# Patient Record
Sex: Female | Born: 1955 | Race: White | Hispanic: No | Marital: Married | Smoking: Former smoker
Health system: Southern US, Community
[De-identification: ages and names within clinical notes are randomized; demographics above are authoritative.]

## PROBLEM LIST (undated history)

## (undated) DIAGNOSIS — I614 Nontraumatic intracerebral hemorrhage in cerebellum: Secondary | ICD-10-CM

## (undated) DIAGNOSIS — I639 Cerebral infarction, unspecified: Secondary | ICD-10-CM

## (undated) DIAGNOSIS — E785 Hyperlipidemia, unspecified: Secondary | ICD-10-CM

## (undated) DIAGNOSIS — Z842 Family history of other diseases of the genitourinary system: Secondary | ICD-10-CM

## (undated) DIAGNOSIS — B029 Zoster without complications: Secondary | ICD-10-CM

## (undated) HISTORY — PX: TETRALOGY OF FALLOT REPAIR: SHX796

## (undated) HISTORY — PX: ABDOMINAL HYSTERECTOMY: SHX81

## (undated) HISTORY — PX: URETERAL STENT PLACEMENT: SHX822

## (undated) HISTORY — DX: Nontraumatic intracerebral hemorrhage in cerebellum: I61.4

## (undated) HISTORY — DX: Family history of other diseases of the genitourinary system: Z84.2

## (undated) HISTORY — PX: TUBAL LIGATION: SHX77

## (undated) HISTORY — DX: Zoster without complications: B02.9

## (undated) HISTORY — DX: Hyperlipidemia, unspecified: E78.5

## (undated) HISTORY — DX: Cerebral infarction, unspecified: I63.9

---

## 2008-12-17 ENCOUNTER — Ambulatory Visit: Payer: Self-pay | Admitting: Cardiology

## 2008-12-17 ENCOUNTER — Encounter: Payer: Self-pay | Admitting: Cardiology

## 2008-12-18 ENCOUNTER — Encounter: Payer: Self-pay | Admitting: Cardiology

## 2009-01-06 ENCOUNTER — Ambulatory Visit: Payer: Self-pay | Admitting: Cardiology

## 2009-01-06 DIAGNOSIS — Q213 Tetralogy of Fallot: Secondary | ICD-10-CM

## 2009-01-06 DIAGNOSIS — I6789 Other cerebrovascular disease: Secondary | ICD-10-CM | POA: Insufficient documentation

## 2009-01-06 DIAGNOSIS — G459 Transient cerebral ischemic attack, unspecified: Secondary | ICD-10-CM | POA: Insufficient documentation

## 2009-01-06 DIAGNOSIS — I498 Other specified cardiac arrhythmias: Secondary | ICD-10-CM

## 2009-01-20 ENCOUNTER — Telehealth (INDEPENDENT_AMBULATORY_CARE_PROVIDER_SITE_OTHER): Payer: Self-pay | Admitting: *Deleted

## 2009-03-03 ENCOUNTER — Ambulatory Visit: Payer: Self-pay | Admitting: Cardiology

## 2009-03-05 ENCOUNTER — Encounter: Payer: Self-pay | Admitting: Cardiology

## 2009-03-15 ENCOUNTER — Encounter (INDEPENDENT_AMBULATORY_CARE_PROVIDER_SITE_OTHER): Payer: Self-pay | Admitting: *Deleted

## 2009-05-25 ENCOUNTER — Ambulatory Visit: Payer: Self-pay | Admitting: Cardiology

## 2010-05-31 NOTE — Assessment & Plan Note (Signed)
Summary: 3 mo fu per dec reminder-srs   Visit Type:  Follow-up Primary Provider:  Lajuana Matte (In [place])  CC:  follow-up visit.  History of Present Illness: the patient is a 55 year old female with history of repair of tetralogy of flow at age 68. 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 There was an area of Michael hemorrhage in the right cerebellum. An echocardiogram demonstrated normal LV function and left atrial chamber size being normal. The right ventricle is mildly dilated and right ventricle systolic function is mildly reduced. However the pulmonary outflow tract could not will be visualized. There was no residual VSD and again the pulmonic count which could not be visualized. Subsequently the patient wore a CardioNet monitor to make sure that showed no significant arrhythmias. In particular there was no evidence of nonsustained ventricular tachycardia or atrial fibrillation. Of note was a did not feel an TEE was indicated given the fact that the patient did not have pathologic pulmonic murmur. The patient presents for followup. She states having quite well. She denies any chest pain shortness of breath either at rest or on exertion. She has no orthopnea or PND.  Clinical Review Panels:  Echocardiogram Echocardiogram 1. The study quality is poor.          2. The left ventricular chamber size is normal.         3. Global left ventricular wall motion and contractility are within         normal limits.         4. The EF is 60%.         5. The right ventricle is mild to moderately dilated.          6. The right ventricular global systolic function is moderately         reduced.         7. There is mild aortic regurgitation.          8. There is mild mitral regurgitation.          9. The pulmonic valve is not well visualized.         10. 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 There is no old  study to compare. There is no obvious residual VSD. I can not see the         pulmonary outflow tract well. There is RV dysfunction. (12/17/2008)  Carotid Studies Carotid Doppler Results RIGHT CAROTID ARTERY: Minimal calcified plaque in the right bulb.         Moderate plaque at the origin of the right external carotid artery.         Low resistance Doppler wave form with sharp upstroke in the         internal.                   RIGHT VERTEBRAL ARTERY:  Antegrade.                   LEFT CAROTID ARTERY: Minimal plaque in the bulb.  Low resistance         Doppler wave form in the internal.  Sharp upstroke is noted.                   LEFT VERTEBRAL ARTERY:  Antegrade.                   IMPRESSION:  Less than 50% stenosis in the right and left internal carotid         arteries.  Mild calcified plaque in the right bulb. (12/17/2008)    Preventive Screening-Counseling & Management  Alcohol-Tobacco     Smoking Status: quit     Year Quit: 12/16/2008  Current Medications (verified): 1)  Simvastatin 20 Mg Tabs (Simvastatin) .... Take One Tablet By Mouth Daily At Bedtime 2)  Zoloft 100 Mg Tabs (Sertraline Hcl) .... Take 1 1/2 Tablet By Mouth Once Daily 3)  Valtrex 500 Mg Tabs (Valacyclovir Hcl) .... Take 1 Tablet By Mouth Once A Day 4)  Aspirin Ec 325 Mg Tbec (Aspirin) .... Take One Tablet By Mouth Daily 5)  Fish Oil 1000 Mg Caps (Omega-3 Fatty Acids) .... Take 2 Capsules At Bedtime 6)  Glucosamine Chondr 1500 Complx  Caps (Glucosamine-Chondroit-Vit C-Mn) .... Take 2 Capsules At Bedtime 7)  Metoprolol Tartrate 50 Mg Tabs (Metoprolol Tartrate) .... Take 1 Tablet By Mouth Two Times A Day  Allergies (verified): No Known Drug Allergies  Comments:  Nurse/Medical Assistant: The patient's medications and allergies were reviewed with the patient and were updated in the Medication and Allergy Lists. Verbally gave names.  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  Alcohol Use - no Drug Use - no  Risk Factors: Smoking Status: quit (05/25/2009)  Review of Systems  The patient denies fatigue, malaise, fever, weight gain/loss, vision loss, decreased hearing, hoarseness, chest pain, palpitations, shortness of breath, prolonged cough, wheezing, sleep apnea, coughing up blood, abdominal pain, blood in stool, nausea, vomiting, diarrhea, heartburn, incontinence, blood in urine, muscle weakness, joint pain, leg swelling, rash, skin lesions, headache, fainting, dizziness, depression, anxiety, enlarged lymph nodes, easy bruising or bleeding, and environmental allergies.    Vital Signs:  Patient profile:   55 year old female Height:      68 inches Weight:      230 pounds Pulse rate:   60 / minute BP sitting:   118 / 75  (left arm) Cuff size:   large  Vitals Entered By: Carlye Grippe (May 25, 2009 1:56 PM) CC: follow-up visit   Physical Exam  Additional Exam:  General: Well-developed, well-nourished in no distress head: Normocephalic and atraumatic eyes PERRLA/EOMI intact, conjunctiva and lids normal nose: No deformity or lesions mouth normal dentition, normal posterior pharynx neck: Supple, no JVD.  No masses, thyromegaly or abnormal cervical nodes lungs: Normal breath sounds bilaterally without wheezing.  Normal percussion heart: regular rate and rhythm with normal S1 and S2, no S3 or S4.  PMI is normal.  No pathological murmurs, in particular no pathologic murmur at the level of the  pulmonic valve and right ventricle outflow tract abdomen: Normal bowel sounds, abdomen is soft and nontender without masses, organomegaly or hernias noted.  No hepatosplenomegaly musculoskeletal: Back normal, normal gait muscle strength and tone normal pulsus: Pulse is normal in all 4 extremities Extremities: No peripheral pitting edema neurologic: Alert and oriented x 3 skin: Intact without lesions or rashes cervical nodes: No significant adenopathy psychologic: Normal affect    Impression &  Recommendations:  Problem # 1:  ACUTE BUT ILL-DEFINED CEREBROVASCULAR DISEASE (ICD-436) no evidence of atrial fibrillation or other arrhythmias. At the present time no clear indication for Coumadin therapy. 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 Clinically there is no evidence of residual VSD or pathology of the right ventricle outflow tract. The right ventricle is dilated and there is some RV dysfunction but the patient is asymptomatic. We can follow this clinically for the time being  Patient Instructions: 1)  Your physician recommends that you continue on your current medications as directed. Please refer to the Current Medication list given to you today. 2)  Follow up in  1 year.

## 2017-01-12 ENCOUNTER — Encounter: Payer: Self-pay | Admitting: *Deleted

## 2017-01-15 ENCOUNTER — Ambulatory Visit (INDEPENDENT_AMBULATORY_CARE_PROVIDER_SITE_OTHER): Payer: BLUE CROSS/BLUE SHIELD | Admitting: Cardiovascular Disease

## 2017-01-15 ENCOUNTER — Encounter: Payer: Self-pay | Admitting: *Deleted

## 2017-01-15 ENCOUNTER — Encounter: Payer: Self-pay | Admitting: Cardiovascular Disease

## 2017-01-15 ENCOUNTER — Telehealth: Payer: Self-pay | Admitting: Cardiovascular Disease

## 2017-01-15 VITALS — BP 132/79 | HR 66 | Ht 65.0 in | Wt 241.0 lb

## 2017-01-15 DIAGNOSIS — I10 Essential (primary) hypertension: Secondary | ICD-10-CM

## 2017-01-15 DIAGNOSIS — I429 Cardiomyopathy, unspecified: Secondary | ICD-10-CM

## 2017-01-15 DIAGNOSIS — Z9289 Personal history of other medical treatment: Secondary | ICD-10-CM

## 2017-01-15 DIAGNOSIS — I4892 Unspecified atrial flutter: Secondary | ICD-10-CM | POA: Diagnosis not present

## 2017-01-15 DIAGNOSIS — R0609 Other forms of dyspnea: Secondary | ICD-10-CM | POA: Diagnosis not present

## 2017-01-15 DIAGNOSIS — Q249 Congenital malformation of heart, unspecified: Secondary | ICD-10-CM

## 2017-01-15 DIAGNOSIS — R0789 Other chest pain: Secondary | ICD-10-CM | POA: Diagnosis not present

## 2017-01-15 MED ORDER — METOPROLOL SUCCINATE ER 50 MG PO TB24: 50.0000 mg | ORAL_TABLET | Freq: Two times a day (BID) | ORAL | 6 refills | Status: DC

## 2017-01-15 MED ORDER — SACUBITRIL-VALSARTAN 24-26 MG PO TABS: 1.0000 | ORAL_TABLET | Freq: Two times a day (BID) | ORAL | 6 refills | Status: DC

## 2017-01-15 NOTE — Telephone Encounter (Signed)
Lexiscan - chest pressure, cardiomyopathy Sept 25, 2018 at [facility]

## 2017-01-15 NOTE — Progress Notes (Signed)
CARDIOLOGY CONSULT NOTE  Patient ID: Vanessa Kennedy MRN: [id number] DOB/AGE: Jun 05, 1955 61 y.o.  Admit date: (Not on file) Primary Physician: System, Pcp Not In Referring Physician: Dr. Elby Beck  Reason for Consultation: rapid atrial flutter  HPI: Vanessa Kennedy is a 61 y.o. female who is being seen today for the evaluation of Rapid atrial flutter at the request of Timmie Foerster, MD.   She was hospitalized earlier this month at [facility]. 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  She last saw Dr. Ambrose Mantle at [facility] on 11/08/12.  Cardiac magnetic resonance imaging on 11/23/11 demonstrated low normal left ventricular systolic function, LVEF 50% with severe dilatation of the right ventricle and mildly reduced right ventricular systolic function, RVEF 39%. There was mild eccentric pulmonic regurgitation and no evidence of atrial or ventricular septal defect at that time. There was biatrial enlargement.  She presented to the hospital there with worsening shortness of breath with exertion. ECG demonstrated atrial flutter with variable conduction. Chest x-ray was suggestive of CHF.  I reviewed all relevant documentation, labs, and studies.  Labs: Sodium 140, potassium 4, BUN 20, creatinine 0.75, troponin normal, N-terminal proBNP 1992, hemoglobin 12.2, white blood cells 5.9, platelets 156.  Echocardiogram performed on 01/04/17 at [facility] reportedly demonstrated moderately reduced left ventricular systolic function, LVEF 30-35%, grade 2 diastolic dysfunction, elevated filling pressures, mild LVH, moderate left atrial dilatation, moderate to severe right ventricular dilatation, moderately reduced right ventricular systolic function, moderate right atrial dilatation, mild aortic regurgitation, and small VSD with left-to-right shunting.  I personally reviewed the ECG performed on 01/03/17 which  demonstrated atrial flutter, 106 bpm, nonspecific ST segment and T-wave abnormalities, with RBBB.  She also has a history of retroperitoneal fibrosis with ureteral stents.  Since being hospitalized, she has felt significantly fatigued. She is tired after doing simple activities such as cooking and washing dishes. 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 She has not gone back to work. She has occasional chest pressure with exertion. She denies leg swelling, orthopnea, and paroxysmal nocturnal dyspnea.  Labs 01/04/17: TSH 0.25, free T4 1.36, free T3 2.6.  ECG performed in the office today which I ordered and personally interpreted demonstrated atrial flutter with variable conduction, heart rate 92 bpm, with right bundle-branch block.   No Known Allergies  Current Outpatient Prescriptions  Medication Sig Dispense Refill  . apixaban (ELIQUIS) 5 MG TABS tablet Take 5 mg by mouth 2 (two) times daily.    . cyclobenzaprine (FLEXERIL) 10 MG tablet Take 10 mg by mouth 3 (three) times daily.    . ferrous sulfate 325 (65 FE) MG tablet Take 325 mg by mouth daily with breakfast.    . furosemide (LASIX) 40 MG tablet Take 40 mg by mouth.    . METOPROLOL TARTRATE PO Take 50 mg by mouth 2 (two) times daily.    . Potassium 99 MG TABS Take by mouth.    . sertraline (ZOLOFT) 100 MG tablet Take 100 mg by mouth daily. 1 and 1/2 tab daily    . tamsulosin (FLOMAX) 0.4 MG CAPS capsule Take 0.4 mg by mouth.    . valACYclovir (VALTREX) 500 MG tablet Take 500 mg by mouth daily.     No current facility-administered medications for this visit.     Past Medical History:  Diagnosis Date  . 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 FH: TAH-BSO (total abdominal hysterectomy and bilateral salpingo-oophorectomy)   . Hemorrhagic cerebellum (HCC)    right cerebellum microhemorrage  . Hyperlipidemia   . Shingles      Past Surgical History:  Procedure Laterality Date  . ABDOMINAL HYSTERECTOMY    . TETRALOGY OF FALLOT REPAIR    . TUBAL LIGATION    . URETERAL STENT PLACEMENT      Social History   Social History  . Marital status: Married    Spouse name: N/A  . Number of children: N/A  . Years of education: N/A   Occupational History  . Not on file.   Social History Main Topics  . Smoking status: Former Smoker    Quit date: 05/01/2008  . Smokeless tobacco: Never Used  . Alcohol use Yes  . Drug use: No  . Sexual activity: Not on file   Other Topics Concern  . Not on file   Social History Narrative  . No narrative on file     No family history of premature CAD in 1st degree relatives.  Current Meds  Medication Sig  . apixaban (ELIQUIS) 5 MG TABS tablet Take 5 mg by mouth 2 (two) times daily.  . cyclobenzaprine (FLEXERIL) 10 MG tablet Take 10 mg by mouth 3 (three) times daily.  . ferrous sulfate 325 (65 FE) MG tablet Take 325 mg by mouth daily with breakfast.  . furosemide (LASIX) 40 MG tablet Take 40 mg by mouth.  . METOPROLOL TARTRATE PO Take 50 mg by mouth 2 (two) times daily.  . Potassium 99 MG TABS Take by mouth.  . sertraline (ZOLOFT) 100 MG tablet Take 100 mg by mouth daily. 1 and 1/2 tab daily  . tamsulosin (FLOMAX) 0.4 MG CAPS capsule Take 0.4 mg by mouth.  . valACYclovir (VALTREX) 500 MG tablet Take 500 mg by mouth daily.      Review of systems complete and found to be negative unless listed above in HPI    Physical exam Blood pressure 132/79, pulse 66, height  (1.651 m), weight 241 lb (109.3 kg), SpO2 92 %. General: NAD Neck: No JVD, no thyromegaly or thyroid nodule.  Lungs: Clear to auscultation bilaterally with normal respiratory effort. CV: Nondisplaced PMI. Regular rate and irregular rhythm, normal S1/S2, no S3, no murmur.  No peripheral edema.  No carotid bruit.    Abdomen: Soft, nontender, no distention.  Skin: Intact without lesions or rashes.   Neurologic: Alert and oriented x 3.  Psych: Normal affect. Extremities: No clubbing or cyanosis.  HEENT: Normal.   ECG: Most recent ECG reviewed.   Labs: No results found for: K, BUN, CREATININE, ALT, TSH, HGB   Lipids: No results found for: LDLCALC, LDLDIRECT, CHOL, TRIG, HDL      ASSESSMENT AND PLAN:  1. Atrial flutter with exertional dyspnea: Currently on metoprolol tartrate 50 mg twice daily. Given her cardiomyopathy, I will switch to Toprol-XL initially 50 mg twice daily. She is anticoagulated with apixaban. I would consider EP referral for ablation. She does have moderate left atrial enlargement.  2. Cardiomyopathy with moderately reduced left ventricle systolic function and chest pressure: She is currently on Lasix 40 mg daily and appears euvolemic. Currently on metoprolol tartrate 50 mg twice daily. Given her cardiomyopathy, I will switch to Toprol-XL initially 50 mg twice daily. I will also add Entresto 24/26 mg twice daily. Unclear as  to the etiology. I will obtain a Lexiscan Myoview stress test to evaluate for ischemic heart disease.  3. Hypertension: Controlled. Monitor given medication adjustments as noted above.  4. Repaired Tetralogy of Fallot: I will make a referral to Dr. Virgina Jock, and adult congenital heart disease specialist with [facility].    Disposition: Follow up with me in 1 month.   Signed: Prentice Docker, M.D., F.A.C.C.  01/15/2017, 3:21 PM

## 2017-01-15 NOTE — Patient Instructions (Addendum)
Medication Instructions:   Stop Lopressor.   Begin Toprol XL  twice a day.  Begin Entresto 24/26mg  twice a day.  Continue all other medications.    Labwork: none  Testing/Procedures:  Your physician has requested that you have a lexiscan myoview. For further information please visit [email or web address] Please follow instruction sheet, as given.  Office will contact with results via phone or letter.    Follow-Up: 1 month   Any Other Special Instructions Will Be Listed Below (If Applicable). You have been referred to:  Dr. Darcel Bayley - congenital heart disease specialist with [facility].    If you need a refill on your cardiac medications before your next appointment, please call your pharmacy.

## 2017-01-23 ENCOUNTER — Encounter (HOSPITAL_COMMUNITY)
Admission: RE | Admit: 2017-01-23 | Discharge: 2017-01-23 | Disposition: A | Discharge status: Home / Self Care | Payer: BLUE CROSS/BLUE SHIELD | Source: Ambulatory Visit | Attending: Cardiovascular Disease | Admitting: Cardiovascular Disease

## 2017-01-23 ENCOUNTER — Encounter (HOSPITAL_BASED_OUTPATIENT_CLINIC_OR_DEPARTMENT_OTHER)
Admission: RE | Admit: 2017-01-23 | Discharge: 2017-01-23 | Disposition: A | Discharge status: Home / Self Care | Payer: BLUE CROSS/BLUE SHIELD | Source: Ambulatory Visit | Attending: Cardiovascular Disease | Admitting: Cardiovascular Disease

## 2017-01-23 ENCOUNTER — Encounter (HOSPITAL_COMMUNITY): Payer: Self-pay

## 2017-01-23 DIAGNOSIS — R0789 Other chest pain: Secondary | ICD-10-CM

## 2017-01-23 DIAGNOSIS — I429 Cardiomyopathy, unspecified: Secondary | ICD-10-CM | POA: Diagnosis not present

## 2017-01-23 LAB — NM MYOCAR MULTI W/SPECT W/WALL MOTION / EF
CHL CUP NUCLEAR SRS: 4
CHL CUP NUCLEAR SSS: 16
CSEPPHR: 95 {beats}/min
LV dias vol: 152 mL (ref 46–106)
LV sys vol: 81 mL
RATE: 0.34
Rest HR: 65 {beats}/min
SDS: 12
TID: 0.93

## 2017-01-23 MED ORDER — TECHNETIUM TC 99M TETROFOSMIN IV KIT
10.0000 | PACK | Freq: Once | INTRAVENOUS | Status: AC | PRN
  Administered 2017-01-23: 9 via INTRAVENOUS

## 2017-01-23 MED ORDER — TECHNETIUM TC 99M TETROFOSMIN IV KIT
30.0000 | PACK | Freq: Once | INTRAVENOUS | Status: AC | PRN
  Administered 2017-01-23: 30 via INTRAVENOUS

## 2017-01-23 MED ORDER — REGADENOSON 0.4 MG/5ML IV SOLN
INTRAVENOUS | Status: AC
  Administered 2017-01-23: 0.4 mg via INTRAVENOUS
  Filled 2017-01-23: qty 5

## 2017-01-23 MED ORDER — SODIUM CHLORIDE 0.9% FLUSH
INTRAVENOUS | Status: AC
  Administered 2017-01-23: 10 mL via INTRAVENOUS
  Filled 2017-01-23: qty 10

## 2017-01-24 ENCOUNTER — Telehealth: Payer: Self-pay | Admitting: *Deleted

## 2017-01-24 NOTE — Telephone Encounter (Signed)
Notes recorded by Lesle Chris, LPN on 1/61/0960 at 9:29 AM EDT Patient notified. Appointment moved up from 02-14-17 to 01-31-17 at 1:40 in the [place] office per SK. ------  Notes recorded by Laqueta Linden, MD on 01/23/2017 at 4:00 PM EDT Have her appt moved up to 1:40 on 10/3 so that I can discuss stress test findings, which are suggestive of blockages.

## 2017-01-31 ENCOUNTER — Encounter: Payer: Self-pay | Admitting: Cardiovascular Disease

## 2017-01-31 ENCOUNTER — Other Ambulatory Visit: Payer: Self-pay

## 2017-01-31 ENCOUNTER — Other Ambulatory Visit: Payer: Self-pay | Admitting: Cardiovascular Disease

## 2017-01-31 ENCOUNTER — Ambulatory Visit (INDEPENDENT_AMBULATORY_CARE_PROVIDER_SITE_OTHER): Payer: BLUE CROSS/BLUE SHIELD | Admitting: Cardiovascular Disease

## 2017-01-31 ENCOUNTER — Telehealth: Payer: Self-pay

## 2017-01-31 VITALS — BP 138/68 | HR 66 | Ht 65.0 in | Wt 243.0 lb

## 2017-01-31 DIAGNOSIS — Z01818 Encounter for other preprocedural examination: Secondary | ICD-10-CM

## 2017-01-31 DIAGNOSIS — R0609 Other forms of dyspnea: Secondary | ICD-10-CM

## 2017-01-31 DIAGNOSIS — I429 Cardiomyopathy, unspecified: Secondary | ICD-10-CM

## 2017-01-31 DIAGNOSIS — Q249 Congenital malformation of heart, unspecified: Secondary | ICD-10-CM | POA: Diagnosis not present

## 2017-01-31 DIAGNOSIS — I10 Essential (primary) hypertension: Secondary | ICD-10-CM

## 2017-01-31 DIAGNOSIS — R9439 Abnormal result of other cardiovascular function study: Secondary | ICD-10-CM | POA: Diagnosis not present

## 2017-01-31 DIAGNOSIS — R0789 Other chest pain: Secondary | ICD-10-CM | POA: Diagnosis not present

## 2017-01-31 DIAGNOSIS — I4892 Unspecified atrial flutter: Secondary | ICD-10-CM | POA: Diagnosis not present

## 2017-01-31 DIAGNOSIS — I209 Angina pectoris, unspecified: Secondary | ICD-10-CM

## 2017-01-31 NOTE — Progress Notes (Signed)
SUBJECTIVE: The patient returns for follow-up after undergoing cardiovascular testing performed for the evaluation of chest pain.  Nuclear stress test on 01/23/17 demonstrated anterior myocardial infarction with mild to moderate peri-infarct ischemia. There was also a moderate area of inferior wall ischemia. It was a high risk study.  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  Echocardiogram performed on 01/04/17 at [facility] reportedly demonstrated moderately reduced left ventricular systolic function, LVEF 30-35%, grade 2 diastolic dysfunction, elevated filling pressures, mild LVH, moderate left atrial dilatation, moderate to severe right ventricular dilatation, moderately reduced right ventricular systolic function, moderate right atrial dilatation, mild aortic regurgitation, and small VSD with left-to-right shunting.  I switched metoprolol tartrate to Toprol-XL 50 mg twice daily and added Entresto at her last visit.  I also made a referral to Dr. Virgina Jock, and adult congenital heart disease specialist with [facility].  She feels very weak and tired after performing activities such as washing the dishes and cooking. She has exertional dyspnea. She has had lightheadedness and dizziness but denies syncope. She has headaches every morning. She continues to experience chest pressure.  She is scheduled to see Dr. Deatra James on 03/08/17.   Review of Systems: As per "subjective", otherwise negative.  No Known Allergies  Current Outpatient Prescriptions  Medication Sig Dispense Refill  . apixaban (ELIQUIS) 5 MG TABS tablet Take 5 mg by mouth 2 (two) times daily.    . cyclobenzaprine (FLEXERIL) 10 MG tablet Take 10 mg by mouth 3 (three) times daily.    . ferrous sulfate 325 (65 FE) MG tablet Take 325 mg by mouth daily with breakfast.    . furosemide  (LASIX) 40 MG tablet Take 40 mg by mouth.    . metoprolol succinate (TOPROL-XL) 50 MG 24 hr tablet Take 1 tablet (50 mg total) by mouth 2 (two) times daily. 60 tablet 6  . Potassium 99 MG TABS Take by mouth.    . sacubitril-valsartan (ENTRESTO) 24-26 MG Take 1 tablet by mouth 2 (two) times daily. 60 tablet 6  . sertraline (ZOLOFT) 100 MG tablet Take 150 mg by mouth daily. 1 and 1/2 tab daily      . tamsulosin (FLOMAX) 0.4 MG CAPS capsule Take 0.4 mg by mouth.    . valACYclovir (VALTREX) 500 MG tablet Take 1,000 mg by mouth daily.      No current facility-administered medications for this visit.     Past Medical History:  Diagnosis Date  . 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 FH: TAH-BSO (total abdominal hysterectomy and bilateral salpingo-oophorectomy)   . Hemorrhagic cerebellum (HCC)    right cerebellum microhemorrage  . Hyperlipidemia   . Shingles     Past Surgical History:  Procedure Laterality Date  . ABDOMINAL HYSTERECTOMY    . TETRALOGY OF FALLOT REPAIR    . TUBAL LIGATION    . URETERAL STENT PLACEMENT      Social History   Social History  . Marital status: Married    Spouse name: N/A  . Number of children: N/A  . Years of education: N/A   Occupational History  . Not on file.   Social History Main Topics  . Smoking status: Former Smoker    Quit date: 05/01/2008  . Smokeless tobacco: Never Used  . Alcohol use No  .  Drug use: No  . Sexual activity: Yes   Other Topics Concern  . Not on file   Social History Narrative  . No narrative on file     Vitals:   01/31/17 1343  BP: 138/68  Pulse: 66  SpO2: 95%  Weight: 243 lb (110.2 kg)  Height: 5' 5" (1.651 m)    Wt Readings from Last 3 Encounters:  01/31/17 243 lb (110.2 kg)  01/15/17 241 lb (109.3 kg)  05/25/09 (!) 230 lb (104.3 kg)     PHYSICAL EXAM General: NAD HEENT: Normal. Neck: No JVD, no thyromegaly. Lungs: Clear to auscultation bilaterally with  normal respiratory effort. CV: Nondisplaced PMI.  Regular rate and rhythm, normal S1/S2, no S3/S4, no murmur. No pretibial or periankle edema.    Abdomen: Soft, nontender, no distention.  Neurologic: Alert and oriented.  Psych: Normal affect. Skin: Normal. Musculoskeletal: No gross deformities.    ECG: Most recent ECG reviewed.   Labs: No results found for: K, BUN, CREATININE, ALT, TSH, HGB   Lipids: No results found for: LDLCALC, LDLDIRECT, CHOL, TRIG, HDL     ASSESSMENT AND PLAN:  1. Atrial flutter: She is stable from an arrhythmic standpoint. Continue Toprol-XL 50 mg twice daily. She is anticoagulated with apixaban. I would consider EP referral for ablation. She does have moderate left atrial enlargement.  2. Cardiomyopathy with moderately reduced left ventricle systolic function and chest pressure with exertional dyspnea: Nuclear stress test markedly abnormal as detailed above suggestive of ischemic heart disease. I will arrange for right and left heart catheterization and coronary angiography. Risks and benefits of cardiac catheterization have been discussed with the patient.  These include bleeding, infection, kidney damage, stroke, heart attack, death.  The patient understands these risks and is willing to proceed. She is currently on Lasix 40 mg daily and appears euvolemic. Continue Toprol-XL 50 mg twice daily and Entresto 24/26 mg twice daily.  3. Hypertension: Controlled. No changes.  4. Repaired Tetralogy of Fallot: I have already made a referral to Dr. Rich Krasuski, and adult congenital heart disease specialist with [facility]. She is scheduled to see him on 03/08/17.     Disposition: Follow up with me in 1 month.  Time spent: 40 minutes, of which greater than 50% was spent reviewing symptoms, relevant blood tests and studies, and discussing management plan with the patient.    Suresh Koneswaran, M.D., F.A.C.C. 

## 2017-01-31 NOTE — Patient Instructions (Addendum)
Medication Instructions:  Your physician recommends that you continue on your current medications as directed. Please refer to the Current Medication list given to you today.   Labwork: Wednesday CBC BMET INR  Testing/Procedures: A chest x-ray takes a picture of the organs and structures inside the chest, including the heart, lungs, and blood vessels. This test can show several things, including, whether the heart is enlarges; whether fluid is building up in the lungs; and whether pacemaker / defibrillator leads are still in place.   Your physician has requested that you have a cardiac catheterization. Cardiac catheterization is used to diagnose and/or treat various heart conditions. Doctors may recommend this procedure for a number of different reasons. The most common reason is to evaluate chest pain. Chest pain can be a symptom of coronary artery disease (CAD), and cardiac catheterization can show whether plaque is narrowing or blocking your heart's arteries. This procedure is also used to evaluate the valves, as well as measure the blood flow and oxygen levels in different parts of your heart. For further information please visit [email or web address] Please follow instruction sheet, as given.   Follow-Up: Your physician recommends that you schedule a follow-up appointment in: 1 MONTH    Any Other Special Instructions Will Be Listed Below (If Applicable).     If you need a refill on your cardiac medications before your next appointment, please call your pharmacy.    [facility] CARDIOVASCULAR DIVISION [facility] [place] [address] [place] [address] Dept: [phone] Loc: [phone]  Ninetta Adelstein  01/31/2017  You are scheduled for a Cardiac Catheterization on Thursday, October 11 with Dr. Peter [place].  1. Please arrive at the [facility] (Main Entrance A) at [facility]: [address] [place], [place] [address] at 5:30 AM  (two hours before your procedure to ensure your preparation). Free valet parking service is available.   Special note: Every effort is made to have your procedure done on time. Please understand that emergencies sometimes delay scheduled procedures.  2. Diet: Do not eat or drink anything after midnight prior to your procedure except sips of water to take medications.  3. Labs: You will need to have blood drawn on Monday, October 8 at [facility] [address], [place]  Open: 7am - 6pm, Sat 8am - 12 noon   Phone: [phone]. You do not need to be fasting.  4. Medication instructions in preparation for your procedure:   Stop taking Eliquis (Apixiban) on Tuesday, October 9.    On the morning of your procedure, take your Aspirin and any morning medicines NOT listed above.  You may use sips of water.  5. Plan for one night stay--bring personal belongings. 6. Bring a current list of your medications and current insurance cards. 7. You MUST have a responsible person to drive you home. 8. Someone MUST be with you the first 24 hours after you arrive home or your discharge will be delayed. 9. Please wear clothes that are easy to get on and off and wear slip-on shoes.  Thank you for allowing [place] to care for you!   -- [facility]

## 2017-01-31 NOTE — Telephone Encounter (Signed)
Pt is to have labs done prior to cath.

## 2017-02-05 ENCOUNTER — Ambulatory Visit (HOSPITAL_COMMUNITY)
Admission: RE | Admit: 2017-02-05 | Discharge: 2017-02-05 | Disposition: A | Discharge status: Home / Self Care | Payer: BLUE CROSS/BLUE SHIELD | Source: Ambulatory Visit | Attending: Cardiovascular Disease | Admitting: Cardiovascular Disease

## 2017-02-05 ENCOUNTER — Other Ambulatory Visit (HOSPITAL_COMMUNITY)
Admission: RE | Admit: 2017-02-05 | Discharge: 2017-02-05 | Disposition: A | Discharge status: Home / Self Care | Payer: BLUE CROSS/BLUE SHIELD | Source: Ambulatory Visit | Attending: Cardiovascular Disease | Admitting: Cardiovascular Disease

## 2017-02-05 ENCOUNTER — Encounter (HOSPITAL_COMMUNITY): Payer: Self-pay | Admitting: Radiology

## 2017-02-05 DIAGNOSIS — I700 Atherosclerosis of aorta: Secondary | ICD-10-CM | POA: Diagnosis not present

## 2017-02-05 DIAGNOSIS — Z01818 Encounter for other preprocedural examination: Secondary | ICD-10-CM | POA: Diagnosis present

## 2017-02-05 DIAGNOSIS — Q2546 Tortuous aortic arch: Secondary | ICD-10-CM | POA: Insufficient documentation

## 2017-02-05 DIAGNOSIS — I517 Cardiomegaly: Secondary | ICD-10-CM | POA: Insufficient documentation

## 2017-02-05 LAB — CBC WITH DIFFERENTIAL/PLATELET
BASOS ABS: 0 10*3/uL (ref 0.0–0.1)
BASOS PCT: 0 %
EOS ABS: 0 10*3/uL (ref 0.0–0.7)
Eosinophils Relative: 1 %
HEMATOCRIT: 43.4 % (ref 36.0–46.0)
HEMOGLOBIN: 14 g/dL (ref 12.0–15.0)
Lymphocytes Relative: 28 %
Lymphs Abs: 1.2 10*3/uL (ref 0.7–4.0)
MCH: 29.9 pg (ref 26.0–34.0)
MCHC: 32.3 g/dL (ref 30.0–36.0)
MCV: 92.5 fL (ref 78.0–100.0)
MONOS PCT: 7 %
Monocytes Absolute: 0.3 10*3/uL (ref 0.1–1.0)
NEUTROS ABS: 2.7 10*3/uL (ref 1.7–7.7)
NEUTROS PCT: 64 %
Platelets: 128 10*3/uL — ABNORMAL LOW (ref 150–400)
RBC: 4.69 MIL/uL (ref 3.87–5.11)
RDW: 12.8 % (ref 11.5–15.5)
WBC: 4.3 10*3/uL (ref 4.0–10.5)

## 2017-02-05 LAB — BASIC METABOLIC PANEL
ANION GAP: 8 (ref 5–15)
BUN: 26 mg/dL — ABNORMAL HIGH (ref 6–20)
CHLORIDE: 101 mmol/L (ref 101–111)
CO2: 30 mmol/L (ref 22–32)
CREATININE: 1.04 mg/dL — AB (ref 0.44–1.00)
Calcium: 9.5 mg/dL (ref 8.9–10.3)
GFR calc non Af Amer: 57 mL/min — ABNORMAL LOW (ref >60)
Glucose, Bld: 100 mg/dL — ABNORMAL HIGH (ref 65–99)
POTASSIUM: 4.7 mmol/L (ref 3.5–5.1)
SODIUM: 139 mmol/L (ref 135–145)

## 2017-02-05 LAB — PROTIME-INR
INR: 1.05
PROTHROMBIN TIME: 13.6 s (ref 11.4–15.2)

## 2017-02-07 ENCOUNTER — Telehealth: Payer: Self-pay

## 2017-02-07 NOTE — Telephone Encounter (Signed)
Patient contacted pre-catheterization at [facility] scheduled for:  02/08/2017 @ 0730 Verified arrival time and place:  NT @ 0530 Confirmed AM meds to be taken pre-cath with sip of water: Take ASA Hold Eliquis-last dose 02/05/2017 evening Hold lasix am of Confirmed patient has responsible person to drive home post procedure and observe patient for 24 hours:  yes Addl concerns:  none

## 2017-02-08 ENCOUNTER — Ambulatory Visit (HOSPITAL_COMMUNITY)
Admission: RE | Admit: 2017-02-08 | Discharge: 2017-02-08 | Disposition: A | Discharge status: Home / Self Care | Payer: BLUE CROSS/BLUE SHIELD | Source: Ambulatory Visit | Attending: Cardiology | Admitting: Cardiology

## 2017-02-08 ENCOUNTER — Encounter (HOSPITAL_COMMUNITY)
Admission: RE | Disposition: A | Discharge status: Home / Self Care | Payer: Self-pay | Source: Ambulatory Visit | Attending: Cardiology

## 2017-02-08 ENCOUNTER — Encounter (HOSPITAL_COMMUNITY): Payer: Self-pay | Admitting: Cardiology

## 2017-02-08 DIAGNOSIS — Z87891 Personal history of nicotine dependence: Secondary | ICD-10-CM | POA: Insufficient documentation

## 2017-02-08 DIAGNOSIS — Z7901 Long term (current) use of anticoagulants: Secondary | ICD-10-CM | POA: Diagnosis not present

## 2017-02-08 DIAGNOSIS — I5022 Chronic systolic (congestive) heart failure: Secondary | ICD-10-CM | POA: Diagnosis present

## 2017-02-08 DIAGNOSIS — I4892 Unspecified atrial flutter: Secondary | ICD-10-CM | POA: Diagnosis present

## 2017-02-08 DIAGNOSIS — R0600 Dyspnea, unspecified: Secondary | ICD-10-CM | POA: Diagnosis present

## 2017-02-08 DIAGNOSIS — R42 Dizziness and giddiness: Secondary | ICD-10-CM | POA: Insufficient documentation

## 2017-02-08 DIAGNOSIS — I252 Old myocardial infarction: Secondary | ICD-10-CM | POA: Insufficient documentation

## 2017-02-08 DIAGNOSIS — I429 Cardiomyopathy, unspecified: Secondary | ICD-10-CM | POA: Diagnosis not present

## 2017-02-08 DIAGNOSIS — I209 Angina pectoris, unspecified: Secondary | ICD-10-CM

## 2017-02-08 DIAGNOSIS — R9439 Abnormal result of other cardiovascular function study: Secondary | ICD-10-CM | POA: Diagnosis present

## 2017-02-08 DIAGNOSIS — I10 Essential (primary) hypertension: Secondary | ICD-10-CM | POA: Insufficient documentation

## 2017-02-08 DIAGNOSIS — Z79899 Other long term (current) drug therapy: Secondary | ICD-10-CM | POA: Insufficient documentation

## 2017-02-08 DIAGNOSIS — R51 Headache: Secondary | ICD-10-CM | POA: Diagnosis not present

## 2017-02-08 DIAGNOSIS — Q213 Tetralogy of Fallot: Secondary | ICD-10-CM

## 2017-02-08 HISTORY — PX: RIGHT/LEFT HEART CATH AND CORONARY ANGIOGRAPHY: CATH118266

## 2017-02-08 LAB — POCT I-STAT 3, ART BLOOD GAS (G3+)
Acid-Base Excess: 2 mmol/L (ref 0.0–2.0)
BICARBONATE: 27.7 mmol/L (ref 20.0–28.0)
O2 SAT: 98 %
PCO2 ART: 47.7 mmHg (ref 32.0–48.0)
TCO2: 29 mmol/L (ref 22–32)
pH, Arterial: 7.371 (ref 7.350–7.450)
pO2, Arterial: 113 mmHg — ABNORMAL HIGH (ref 83.0–108.0)

## 2017-02-08 LAB — POCT I-STAT 3, VENOUS BLOOD GAS (G3P V)
ACID-BASE EXCESS: 2 mmol/L (ref 0.0–2.0)
Acid-Base Excess: 1 mmol/L (ref 0.0–2.0)
Acid-Base Excess: 1 mmol/L (ref 0.0–2.0)
Acid-Base Excess: 1 mmol/L (ref 0.0–2.0)
BICARBONATE: 27.6 mmol/L (ref 20.0–28.0)
BICARBONATE: 27.8 mmol/L (ref 20.0–28.0)
BICARBONATE: 29 mmol/L — AB (ref 20.0–28.0)
Bicarbonate: 27.1 mmol/L (ref 20.0–28.0)
Bicarbonate: 28.2 mmol/L — ABNORMAL HIGH (ref 20.0–28.0)
O2 SAT: 58 %
O2 SAT: 65 %
O2 Saturation: 63 %
O2 Saturation: 63 %
O2 Saturation: 63 %
PCO2 VEN: 53.1 mmHg (ref 44.0–60.0)
PCO2 VEN: 53.3 mmHg (ref 44.0–60.0)
PH VEN: 7.326 (ref 7.250–7.430)
PH VEN: 7.331 (ref 7.250–7.430)
PO2 VEN: 33 mmHg (ref 32.0–45.0)
PO2 VEN: 35 mmHg (ref 32.0–45.0)
PO2 VEN: 36 mmHg (ref 32.0–45.0)
PO2 VEN: 37 mmHg (ref 32.0–45.0)
TCO2: 29 mmol/L (ref 22–32)
TCO2: 29 mmol/L (ref 22–32)
TCO2: 31 mmol/L (ref 22–32)
TCO2: 29 mmol/L (ref 22–32)
TCO2: 30 mmol/L (ref 22–32)
pCO2, Ven: 52.3 mmHg (ref 44.0–60.0)
pCO2, Ven: 55.1 mmHg (ref 44.0–60.0)
pCO2, Ven: 56.4 mmHg (ref 44.0–60.0)
pH, Ven: 7.316 (ref 7.250–7.430)
pH, Ven: 7.318 (ref 7.250–7.430)
pH, Ven: 7.319 (ref 7.250–7.430)
pO2, Ven: 36 mmHg (ref 32.0–45.0)

## 2017-02-08 SURGERY — RIGHT/LEFT HEART CATH AND CORONARY ANGIOGRAPHY
Anesthesia: LOCAL

## 2017-02-08 MED ORDER — MIDAZOLAM HCL 2 MG/2ML IJ SOLN
INTRAMUSCULAR | Status: DC | PRN
  Administered 2017-02-08: 1 mg via INTRAVENOUS

## 2017-02-08 MED ORDER — VERAPAMIL HCL 2.5 MG/ML IV SOLN
INTRAVENOUS | Status: DC | PRN
  Administered 2017-02-08: 10 mL via INTRA_ARTERIAL

## 2017-02-08 MED ORDER — APIXABAN 5 MG PO TABS: 5.0000 mg | ORAL_TABLET | Freq: Two times a day (BID) | ORAL | Status: AC

## 2017-02-08 MED ORDER — FENTANYL CITRATE (PF) 100 MCG/2ML IJ SOLN
INTRAMUSCULAR | Status: DC | PRN
  Administered 2017-02-08 (×2): 25 ug via INTRAVENOUS
  Administered 2017-02-08: 25 ug

## 2017-02-08 MED ORDER — HEPARIN SODIUM (PORCINE) 1000 UNIT/ML IJ SOLN
INTRAMUSCULAR | Status: DC | PRN
  Administered 2017-02-08: 5000 [IU] via INTRAVENOUS

## 2017-02-08 MED ORDER — SODIUM CHLORIDE 0.9 % IV SOLN: 250.0000 mL | INTRAVENOUS | Status: DC | PRN

## 2017-02-08 MED ORDER — MIDAZOLAM HCL 2 MG/2ML IJ SOLN
INTRAMUSCULAR | Status: DC | PRN
Start: 2017-02-08 — End: 2017-02-08
  Administered 2017-02-08 (×2): 1 mg via INTRAVENOUS

## 2017-02-08 MED ORDER — IOPAMIDOL (ISOVUE-370) INJECTION 76%
INTRAVENOUS | Status: DC | PRN
  Administered 2017-02-08: 75 mL

## 2017-02-08 MED ORDER — SODIUM CHLORIDE 0.9% FLUSH: 3.0000 mL | Freq: Two times a day (BID) | INTRAVENOUS | Status: DC

## 2017-02-08 MED ORDER — HEPARIN (PORCINE) IN NACL 2-0.9 UNIT/ML-% IJ SOLN
INTRAMUSCULAR | Status: DC | PRN
  Administered 2017-02-08: 09:00:00

## 2017-02-08 MED ORDER — VERAPAMIL HCL 2.5 MG/ML IV SOLN
INTRAVENOUS | Status: AC
  Filled 2017-02-08: qty 2

## 2017-02-08 MED ORDER — LIDOCAINE HCL 2 % IJ SOLN
INTRAMUSCULAR | Status: DC | PRN
  Administered 2017-02-08: 4 mL
  Administered 2017-02-08: 10 mL

## 2017-02-08 MED ORDER — SODIUM CHLORIDE 0.9% FLUSH: 3.0000 mL | INTRAVENOUS | Status: DC | PRN

## 2017-02-08 MED ORDER — MIDAZOLAM HCL 2 MG/2ML IJ SOLN
INTRAMUSCULAR | Status: AC
  Filled 2017-02-08: qty 2

## 2017-02-08 MED ORDER — HEPARIN (PORCINE) IN NACL 2-0.9 UNIT/ML-% IJ SOLN
INTRAMUSCULAR | Status: AC
  Filled 2017-02-08: qty 1000

## 2017-02-08 MED ORDER — IOPAMIDOL (ISOVUE-370) INJECTION 76%
INTRAVENOUS | Status: AC
  Filled 2017-02-08: qty 100

## 2017-02-08 MED ORDER — HEPARIN SODIUM (PORCINE) 1000 UNIT/ML IJ SOLN
INTRAMUSCULAR | Status: AC
  Filled 2017-02-08: qty 1

## 2017-02-08 MED ORDER — SODIUM CHLORIDE 0.9 % WEIGHT BASED INFUSION: 1.0000 mL/kg/h | INTRAVENOUS | Status: AC

## 2017-02-08 MED ORDER — SODIUM CHLORIDE 0.9 % WEIGHT BASED INFUSION
3.0000 mL/kg/h | INTRAVENOUS | Status: AC
  Administered 2017-02-08: 3 mL/kg/h via INTRAVENOUS

## 2017-02-08 MED ORDER — ASPIRIN 81 MG PO CHEW
81.0000 mg | CHEWABLE_TABLET | ORAL | Status: DC
Start: 2017-02-08 — End: 2017-02-08

## 2017-02-08 MED ORDER — FENTANYL CITRATE (PF) 100 MCG/2ML IJ SOLN
INTRAMUSCULAR | Status: AC
  Filled 2017-02-08: qty 2

## 2017-02-08 MED ORDER — SODIUM CHLORIDE 0.9 % WEIGHT BASED INFUSION: 1.0000 mL/kg/h | INTRAVENOUS | Status: DC

## 2017-02-08 MED ORDER — LIDOCAINE HCL 2 % IJ SOLN
INTRAMUSCULAR | Status: AC
  Filled 2017-02-08: qty 10

## 2017-02-08 SURGICAL SUPPLY — 14 items
CATH IMPULSE 5F ANG/FL3.5 (CATHETERS) ×2 IMPLANT
CATH INFINITI 4FR 145 PIGTAIL (CATHETERS) ×2 IMPLANT
CATH SWAN GANZ 7F STRAIGHT (CATHETERS) ×4 IMPLANT
DEVICE RAD COMP TR BAND LRG (VASCULAR PRODUCTS) ×2 IMPLANT
GLIDESHEATH SLEND SS 6F .021 (SHEATH) ×2 IMPLANT
GUIDEWIRE ANGLED .035X150CM (WIRE) ×2 IMPLANT
GUIDEWIRE INQWIRE 1.5J.035X260 (WIRE) ×1 IMPLANT
INQWIRE 1.5J .035X260CM (WIRE) ×2
KIT HEART LEFT (KITS) ×2 IMPLANT
PACK CARDIAC CATHETERIZATION (CUSTOM PROCEDURE TRAY) ×2 IMPLANT
SHEATH GLIDE SLENDER 4/5FR (SHEATH) ×2 IMPLANT
SHEATH PINNACLE 7F 10CM (SHEATH) ×2 IMPLANT
TRANSDUCER W/STOPCOCK (MISCELLANEOUS) ×4 IMPLANT
TUBING CIL FLEX 10 FLL-RA (TUBING) ×2 IMPLANT

## 2017-02-08 NOTE — H&P (View-Only) (Signed)
SUBJECTIVE: The patient returns for follow-up after undergoing cardiovascular testing performed for the evaluation of chest pain.  Nuclear stress test on 01/23/17 demonstrated anterior myocardial infarction with mild to moderate peri-infarct ischemia. There was also a moderate area of inferior wall ischemia. It was a high risk study.  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  Echocardiogram performed on 01/04/17 at [facility] reportedly demonstrated moderately reduced left ventricular systolic function, LVEF 30-35%, grade 2 diastolic dysfunction, elevated filling pressures, mild LVH, moderate left atrial dilatation, moderate to severe right ventricular dilatation, moderately reduced right ventricular systolic function, moderate right atrial dilatation, mild aortic regurgitation, and small VSD with left-to-right shunting.  I switched metoprolol tartrate to Toprol-XL 50 mg twice daily and added Entresto at her last visit.  I also made a referral to Dr. Virgina Jock, and adult congenital heart disease specialist with [facility].  She feels very weak and tired after performing activities such as washing the dishes and cooking. She has exertional dyspnea. She has had lightheadedness and dizziness but denies syncope. She has headaches every morning. She continues to experience chest pressure.  She is scheduled to see Dr. Deatra James on 03/08/17.   Review of Systems: As per "subjective", otherwise negative.  No Known Allergies  Current Outpatient Prescriptions  Medication Sig Dispense Refill  . apixaban (ELIQUIS) 5 MG TABS tablet Take 5 mg by mouth 2 (two) times daily.    . cyclobenzaprine (FLEXERIL) 10 MG tablet Take 10 mg by mouth 3 (three) times daily.    . ferrous sulfate 325 (65 FE) MG tablet Take 325 mg by mouth daily with breakfast.    . furosemide  (LASIX) 40 MG tablet Take 40 mg by mouth.    . metoprolol succinate (TOPROL-XL) 50 MG 24 hr tablet Take 1 tablet (50 mg total) by mouth 2 (two) times daily. 60 tablet 6  . Potassium 99 MG TABS Take by mouth.    . sacubitril-valsartan (ENTRESTO) 24-26 MG Take 1 tablet by mouth 2 (two) times daily. 60 tablet 6  . sertraline (ZOLOFT) 100 MG tablet Take 150 mg by mouth daily. 1 and 1/2 tab daily      . tamsulosin (FLOMAX) 0.4 MG CAPS capsule Take 0.4 mg by mouth.    . valACYclovir (VALTREX) 500 MG tablet Take 1,000 mg by mouth daily.      No current facility-administered medications for this visit.     Past Medical History:  Diagnosis Date  . 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 FH: TAH-BSO (total abdominal hysterectomy and bilateral salpingo-oophorectomy)   . Hemorrhagic cerebellum (HCC)    right cerebellum microhemorrage  . Hyperlipidemia   . Shingles     Past Surgical History:  Procedure Laterality Date  . ABDOMINAL HYSTERECTOMY    . TETRALOGY OF FALLOT REPAIR    . TUBAL LIGATION    . URETERAL STENT PLACEMENT      Social History   Social History  . Marital status: Married    Spouse name: N/A  . Number of children: N/A  . Years of education: N/A   Occupational History  . Not on file.   Social History Main Topics  . Smoking status: Former Smoker    Quit date: 05/01/2008  . Smokeless tobacco: Never Used  . Alcohol use No  .  Drug use: No  . Sexual activity: Yes   Other Topics Concern  . Not on file   Social History Narrative  . No narrative on file     Vitals:   01/31/17 1343  BP: 138/68  Pulse: 66  SpO2: 95%  Weight: 243 lb (110.2 kg)  Height:  (1.651 m)    Wt Readings from Last 3 Encounters:  01/31/17 243 lb (110.2 kg)  01/15/17 241 lb (109.3 kg)  05/25/09 (!) 230 lb (104.3 kg)     PHYSICAL EXAM General: NAD HEENT: Normal. Neck: No JVD, no thyromegaly. Lungs: Clear to auscultation bilaterally with  normal respiratory effort. CV: Nondisplaced PMI.  Regular rate and rhythm, normal S1/S2, no S3/S4, no murmur. No pretibial or periankle edema.    Abdomen: Soft, nontender, no distention.  Neurologic: Alert and oriented.  Psych: Normal affect. Skin: Normal. Musculoskeletal: No gross deformities.    ECG: Most recent ECG reviewed.   Labs: No results found for: K, BUN, CREATININE, ALT, TSH, HGB   Lipids: No results found for: LDLCALC, LDLDIRECT, CHOL, TRIG, HDL     ASSESSMENT AND PLAN:  1. Atrial flutter: She is stable from an arrhythmic standpoint. Continue Toprol-XL 50 mg twice daily. She is anticoagulated with apixaban. I would consider EP referral for ablation. She does have moderate left atrial enlargement.  2. Cardiomyopathy with moderately reduced left ventricle systolic function and chest pressure with exertional dyspnea: Nuclear stress test markedly abnormal as detailed above suggestive of ischemic heart disease. I will arrange for right and left heart catheterization and coronary angiography. Risks and benefits of cardiac catheterization have been discussed with the patient.  These include bleeding, infection, kidney damage, stroke, heart attack, death.  The patient understands these risks and is willing to proceed. She is currently on Lasix 40 mg daily and appears euvolemic. Continue Toprol-XL 50 mg twice daily and Entresto 24/26 mg twice daily.  3. Hypertension: Controlled. No changes.  4. Repaired Tetralogy of Fallot: I have already made a referral to Dr. Virgina Jock, and adult congenital heart disease specialist with [facility]. She is scheduled to see him on 03/08/17.     Disposition: Follow up with me in 1 month.  Time spent: 40 minutes, of which greater than 50% was spent reviewing symptoms, relevant blood tests and studies, and discussing management plan with the patient.    Prentice Docker, M.D., F.A.C.C.

## 2017-02-08 NOTE — Interval H&P Note (Signed)
History and Physical Interval Note:  02/08/2017 7:25 AM  Vanessa Kennedy  has presented today for surgery, with the diagnosis of abnormal stress  The various methods of treatment have been discussed with the patient and family. After consideration of risks, benefits and other options for treatment, the patient has consented to  Procedure(s): RIGHT/LEFT HEART CATH AND CORONARY ANGIOGRAPHY (N/A) as a surgical intervention .  The patient's history has been reviewed, patient examined, no change in status, stable for surgery.  I have reviewed the patient's chart and labs.  Questions were answered to the patient's satisfaction.   Cath Lab Visit (complete for each Cath Lab visit)  Clinical Evaluation Leading to the Procedure:   ACS: No.  Non-ACS:    Anginal Classification: CCS III  Anti-ischemic medical therapy: Minimal Therapy (1 class of medications)  Non-Invasive Test Results: High-risk stress test findings: cardiac mortality >3%/year  Prior CABG: No previous CABG        Theron Arista [facility]add [facility] 02/08/2017 7:26 AM

## 2017-02-08 NOTE — Progress Notes (Signed)
Site area: rt groin fv sheath Site Prior to Removal:  Level 0 Pressure Applied For: 10 minutes Manual:   yes Patient Status During Pull:  stable Post Pull Site:  Level  0 Post Pull Instructions Given:  yes Post Pull Pulses Present: palpable Dressing Applied:  Gauze and tegaderm Bedrest begins @ 0915 Comments:

## 2017-02-08 NOTE — Discharge Instructions (Signed)
Radial Site Care °Refer to this sheet in the next few weeks. These instructions provide you with information about caring for yourself after your procedure. Your health care provider may also give you more specific instructions. Your treatment has been planned according to current medical practices, but problems sometimes occur. Call your health care provider if you have any problems or questions after your procedure. °What can I expect after the procedure? °After your procedure, it is typical to have the following: °· Bruising at the radial site that usually fades within 1-2 weeks. °· Blood collecting in the tissue (hematoma) that may be painful to the touch. It should usually decrease in size and tenderness within 1-2 weeks. ° °Follow these instructions at home: °· Take medicines only as directed by your health care provider. °· You may shower 24-48 hours after the procedure or as directed by your health care provider. Remove the bandage (dressing) and gently wash the site with plain soap and water. Pat the area dry with a clean towel. Do not rub the site, because this may cause bleeding. °· Do not take baths, swim, or use a hot tub until your health care provider approves. °· Check your insertion site every day for redness, swelling, or drainage. °· Do not apply powder or lotion to the site. °· Do not flex or bend the affected arm for 24 hours or as directed by your health care provider. °· Do not push or pull heavy objects with the affected arm for 24 hours or as directed by your health care provider. °· Do not lift over 10 lb (4.5 kg) for 5 days after your procedure or as directed by your health care provider. °· Ask your health care provider when it is okay to: °? Return to work or school. °? Resume usual physical activities or sports. °? Resume sexual activity. °· Do not drive home if you are discharged the same day as the procedure. Have someone else drive you. °· You may drive 24 hours after the procedure  unless otherwise instructed by your health care provider. °· Do not operate machinery or power tools for 24 hours after the procedure. °· If your procedure was done as an outpatient procedure, which means that you went home the same day as your procedure, a responsible adult should be with you for the first 24 hours after you arrive home. °· Keep all follow-up visits as directed by your health care provider. This is important. °Contact a health care provider if: °· You have a fever. °· You have chills. °· You have increased bleeding from the radial site. Hold pressure on the site. °Get help right away if: °· You have unusual pain at the radial site. °· You have redness, warmth, or swelling at the radial site. °· You have drainage (other than a small amount of blood on the dressing) from the radial site. °· The radial site is bleeding, and the bleeding does not stop after 30 minutes of holding steady pressure on the site. °· Your arm or hand becomes pale, cool, tingly, or numb. °This information is not intended to replace advice given to you by your health care provider. Make sure you discuss any questions you have with your health care provider. °Document Released: 05/20/2010 Document Revised: 09/23/2015 Document Reviewed: 11/03/2013 °Elsevier Interactive Patient Education © 2018 [employer] °Femoral Site Care °Refer to this sheet in the next few weeks. These instructions provide you with information about caring for yourself after your procedure. Your   health care provider may also give you more specific instructions. Your treatment has been planned according to current medical practices, but problems sometimes occur. Call your health care provider if you have any problems or questions after your procedure. °What can I expect after the procedure? °After your procedure, it is typical to have the following: °· Bruising at the site that usually fades within 1-2 weeks. °· Blood collecting in the tissue (hematoma) that  may be painful to the touch. It should usually decrease in size and tenderness within 1-2 weeks. ° °Follow these instructions at home: °· Take medicines only as directed by your health care provider. °· You may shower 24-48 hours after the procedure or as directed by your health care provider. Remove the bandage (dressing) and gently wash the site with plain soap and water. Pat the area dry with a clean towel. Do not rub the site, because this may cause bleeding. °· Do not take baths, swim, or use a hot tub until your health care provider approves. °· Check your insertion site every day for redness, swelling, or drainage. °· Do not apply powder or lotion to the site. °· Limit use of stairs to twice a day for the first 2-3 days or as directed by your health care provider. °· Do not squat for the first 2-3 days or as directed by your health care provider. °· Do not lift over 10 lb (4.5 kg) for 5 days after your procedure or as directed by your health care provider. °· Ask your health care provider when it is okay to: °? Return to work or school. °? Resume usual physical activities or sports. °? Resume sexual activity. °· Do not drive home if you are discharged the same day as the procedure. Have someone else drive you. °· You may drive 24 hours after the procedure unless otherwise instructed by your health care provider. °· Do not operate machinery or power tools for 24 hours after the procedure or as directed by your health care provider. °· If your procedure was done as an outpatient procedure, which means that you went home the same day as your procedure, a responsible adult should be with you for the first 24 hours after you arrive home. °· Keep all follow-up visits as directed by your health care provider. This is important. °Contact a health care provider if: °· You have a fever. °· You have chills. °· You have increased bleeding from the site. Hold pressure on the site. °Get help right away if: °· You have  unusual pain at the site. °· You have redness, warmth, or swelling at the site. °· You have drainage (other than a small amount of blood on the dressing) from the site. °· The site is bleeding, and the bleeding does not stop after 30 minutes of holding steady pressure on the site. °· Your leg or foot becomes pale, cool, tingly, or numb. °This information is not intended to replace advice given to you by your health care provider. Make sure you discuss any questions you have with your health care provider. °Document Released: 12/19/2013 Document Revised: 09/23/2015 Document Reviewed: 11/04/2013 °Elsevier Interactive Patient Education © 2018 [employer] Inc. ° °

## 2017-02-14 ENCOUNTER — Ambulatory Visit: Payer: BLUE CROSS/BLUE SHIELD | Admitting: Cardiovascular Disease

## 2017-02-15 ENCOUNTER — Encounter: Payer: Self-pay | Admitting: Cardiovascular Disease

## 2017-02-15 ENCOUNTER — Ambulatory Visit (INDEPENDENT_AMBULATORY_CARE_PROVIDER_SITE_OTHER): Payer: BLUE CROSS/BLUE SHIELD | Admitting: Cardiovascular Disease

## 2017-02-15 VITALS — BP 98/64 | HR 56 | Ht 65.0 in | Wt 243.0 lb

## 2017-02-15 DIAGNOSIS — Q249 Congenital malformation of heart, unspecified: Secondary | ICD-10-CM | POA: Diagnosis not present

## 2017-02-15 DIAGNOSIS — I429 Cardiomyopathy, unspecified: Secondary | ICD-10-CM | POA: Diagnosis not present

## 2017-02-15 DIAGNOSIS — Z87891 Personal history of nicotine dependence: Secondary | ICD-10-CM | POA: Diagnosis not present

## 2017-02-15 DIAGNOSIS — R0609 Other forms of dyspnea: Secondary | ICD-10-CM | POA: Diagnosis not present

## 2017-02-15 DIAGNOSIS — I10 Essential (primary) hypertension: Secondary | ICD-10-CM | POA: Diagnosis not present

## 2017-02-15 DIAGNOSIS — I4892 Unspecified atrial flutter: Secondary | ICD-10-CM

## 2017-02-15 NOTE — Patient Instructions (Signed)
Medication Instructions:  Continue all current medications.  Labwork: none  Testing/Procedures:  Spirometry with graph  Office will contact with results via phone or letter.    Follow-Up: 3 months   Any Other Special Instructions Will Be Listed Below (If Applicable).  If you need a refill on your cardiac medications before your next appointment, please call your pharmacy.

## 2017-02-15 NOTE — Progress Notes (Signed)
SUBJECTIVE: The patient presents for follow-up after undergoing coronary angiography for an abnormal stress test. She had normal coronary anatomy with no significant shunt. There were normal pulmonary artery and left ventricular filling pressures. Right ventricular filling pressure was mildly elevated. Cardiac output was normal.  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  [facility]04[facility]17 [facility] reportedly demonstrated moderately reduced left ventricular systolic function, LVEF 30-35%, grade 2 diastolic dysfunction, elevated filling pressures, mild LVH, moderate left atrial dilatation, moderate to severe right ventricular dilatation, moderately reduced right ventricular systolic function, moderate right atrial dilatation, mild aortic regurgitation, and small VSD with left-to-right shunting.  I previously made a referral to Dr. Virgina Jockich Krasuski, an adult congenital heart disease specialist with [facility], whom she is scheduled to see on 03/08/17.  She denies chest pain but continues to have exertional dyspnea with minimal activity. She is tearful when asking me when she'll be able to return to work, if at all. She tried walking through a grocery store but it took her 2 hours.  She used to smoke 1.5 packs of cigarettes daily for 32 years and quit about 8 years ago.  She denies cough, leg swelling, wheezes, fevers, orthopnea, and paroxysmal nocturnal dyspnea.        Review of Systems: As per "subjective", otherwise negative.  No Known Allergies  Current Outpatient Prescriptions  Medication Sig Dispense Refill  . apixaban (ELIQUIS) 5 MG TABS tablet Take 1 tablet (5 mg total) by mouth 2 (two) times daily. 60 tablet   . atorvastatin (LIPITOR) 40 MG tablet Take 40 mg by mouth every evening.    . cyclobenzaprine (FLEXERIL) 10 MG  tablet Take 10 mg by mouth 3 (three) times daily as needed for muscle spasms.     . ferrous sulfate 325 (65 FE) MG tablet Take 325 mg by mouth daily with breakfast.    . fluticasone (FLONASE) 50 MCG/ACT nasal spray Place 1 spray into both nostrils daily as needed for allergies or rhinitis.    . furosemide (LASIX) 40 MG tablet Take 40 mg by mouth daily.     . metoprolol succinate (TOPROL-XL) 50 MG 24 hr tablet Take 1 tablet (50 mg total) by mouth 2 (two) times daily. 60 tablet 6  . Potassium 99 MG TABS Take 1 tablet by mouth daily.     . sacubitril-valsartan (ENTRESTO) 24-26 MG Take 1 tablet by mouth 2 (two) times daily. 60 tablet 6  . sertraline (ZOLOFT) 100 MG tablet Take 150 mg by mouth daily. 1 and 1/2 tab daily      . tamsulosin (FLOMAX) 0.4 MG CAPS capsule Take 0.4 mg by mouth at bedtime.     . valACYclovir (VALTREX) 500 MG tablet Take 1,000 mg by mouth daily.      No current facility-administered medications for this visit.     Past Medical History:  Diagnosis Date  . 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 FH: TAH-BSO (total abdominal hysterectomy and bilateral salpingo-oophorectomy)   . Hemorrhagic cerebellum (HCC)    right cerebellum microhemorrage  . Hyperlipidemia   . Shingles     Past Surgical History:  Procedure Laterality Date  . ABDOMINAL HYSTERECTOMY    . RIGHT/LEFT HEART CATH AND CORONARY ANGIOGRAPHY N/A 02/08/2017   Procedure: RIGHT/LEFT HEART CATH AND  CORONARY ANGIOGRAPHY;  Surgeon: Swaziland, Peter M, MD;  Location: [facility] INVASIVE CV LAB;  Service: Cardiovascular;  Laterality: N/A;  . TETRALOGY OF FALLOT REPAIR    . TUBAL LIGATION    . URETERAL STENT PLACEMENT      Social History   Social History  . Marital status: Married    Spouse name: N/A  . Number of children: N/A  . Years of education: N/A   Occupational History  . Not on file.   Social History Main Topics  . Smoking status: Former Smoker    Quit date: 05/01/2008    . Smokeless tobacco: Never Used  . Alcohol use No  . Drug use: No  . Sexual activity: Yes   Other Topics Concern  . Not on file   Social History Narrative  . No narrative on file     Vitals:   02/15/17 1410  BP: 98/64  Pulse: (!) 56  SpO2: 97%  Weight: 243 lb (110.2 kg)  Height: 5\' 5"  (1.651 m)    Wt Readings from Last 3 Encounters:  02/15/17 243 lb (110.2 kg)  02/08/17 241 lb (109.3 kg)  01/31/17 243 lb (110.2 kg)     PHYSICAL EXAM General: NAD HEENT: Normal. Neck: No JVD, no thyromegaly. Lungs: Clear to auscultation bilaterally with normal respiratory effort. CV: Regular rate and rhythm, normal S1/S2, no S3/S4, no murmur. No pretibial or periankle edema.  No carotid bruit.   Abdomen: Soft, nontender, no distention.  Neurologic: Alert and oriented.  Psych: Normal affect. Skin: Normal. Musculoskeletal: No gross deformities.    ECG: Most recent ECG reviewed.   Labs: Lab Results  Component Value Date/Time   K 4.7 02/05/2017 12:59 PM   BUN 26 (H) 02/05/2017 12:59 PM   CREATININE 1.04 (H) 02/05/2017 12:59 PM   HGB 14.0 02/05/2017 12:59 PM     Lipids: No results found for: LDLCALC, LDLDIRECT, CHOL, TRIG, HDL     ASSESSMENT AND PLAN:  1. Atrial flutter: She is stable from an arrhythmic standpoint. Continue Toprol-XL 50mg  twice daily. She is anticoagulated with apixaban. I would consider EP referral for ablation. She does have moderate left atrial enlargement.  2. 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 Continue Toprol-XL 50mg  twice daily and Entresto 24/26 mg twice daily along with Lasix 40 mg daily.  3. Hypertension: Controlled. No changes.  4. Repaired Tetralogy of Fallot: I have already made a referral to Dr. Virgina Jock, an adult congenital heart disease specialist with [facility]. She is scheduled to see him on 03/08/17.  5. Dyspnea with  exertion: Some of her symptoms are treatable to reduced left ventricular systolic function. However, she also has a past history of tobacco abuse. I will obtain spirometry to evaluate for obstructive lung disease.     Disposition: Follow up 3 months.   Prentice Docker, M.D., F.A.C.C.

## 2017-02-21 ENCOUNTER — Ambulatory Visit (HOSPITAL_COMMUNITY): Admission: RE | Admit: 2017-02-21 | Payer: BLUE CROSS/BLUE SHIELD | Source: Ambulatory Visit

## 2017-03-02 ENCOUNTER — Ambulatory Visit (HOSPITAL_COMMUNITY)
Admission: RE | Admit: 2017-03-02 | Discharge: 2017-03-02 | Disposition: A | Discharge status: Home / Self Care | Payer: BLUE CROSS/BLUE SHIELD | Source: Ambulatory Visit | Attending: Cardiovascular Disease | Admitting: Cardiovascular Disease

## 2017-03-02 DIAGNOSIS — R942 Abnormal results of pulmonary function studies: Secondary | ICD-10-CM | POA: Diagnosis not present

## 2017-03-02 DIAGNOSIS — R0609 Other forms of dyspnea: Secondary | ICD-10-CM | POA: Diagnosis not present

## 2017-03-02 LAB — SPIROMETRY WITH GRAPH
FEF 25-75 Post: 1.74 L/sec
FEF 25-75 Pre: 2.23 L/sec
FEF2575-%Change-Post: -22 %
FEF2575-%Pred-Post: 73 %
FEF2575-%Pred-Pre: 93 %
FEV1-%CHANGE-POST: -6 %
FEV1-%PRED-PRE: 69 %
FEV1-%Pred-Post: 65 %
FEV1-PRE: 1.84 L
FEV1-Post: 1.72 L
FEV1FVC-%CHANGE-POST: 1 %
FEV1FVC-%Pred-Pre: 108 %
FEV6-%Change-Post: -7 %
FEV6-%PRED-POST: 60 %
FEV6-%PRED-PRE: 65 %
FEV6-PRE: 2.16 L
FEV6-Post: 2 L
FEV6FVC-%PRED-PRE: 104 %
FEV6FVC-%Pred-Post: 104 %
FVC-%Change-Post: -7 %
FVC-%PRED-POST: 58 %
FVC-%PRED-PRE: 63 %
FVC-POST: 2 L
FVC-Pre: 2.16 L
POST FEV1/FVC RATIO: 86 %
POST FEV6/FVC RATIO: 100 %
Pre FEV1/FVC ratio: 85 %
Pre FEV6/FVC Ratio: 100 %

## 2017-03-02 MED ORDER — ALBUTEROL SULFATE (2.5 MG/3ML) 0.083% IN NEBU
2.5000 mg | INHALATION_SOLUTION | Freq: Once | RESPIRATORY_TRACT | Status: AC
  Administered 2017-03-02: 2.5 mg via RESPIRATORY_TRACT

## 2017-03-09 ENCOUNTER — Telehealth: Payer: Self-pay | Admitting: *Deleted

## 2017-03-09 NOTE — Telephone Encounter (Signed)
Notes recorded by Lesle ChrisHill, Damira Kem G, LPN on 82/9/562111/12/2016 at 5:36 PM EST Patient notified and verbalized understanding. Copy to Dr. Tempie Hoistynthia Campos & Dr. Delrae Sawyersichard Krasuski at patient's request. Stated [facility] for procedure to put her heart back in rhythm. ------  Notes recorded by Laqueta LindenKoneswaran, Suresh A, MD on 03/05/2017 at 10:57 AM EST Please make appt with pulmonary.

## 2017-05-24 ENCOUNTER — Ambulatory Visit (INDEPENDENT_AMBULATORY_CARE_PROVIDER_SITE_OTHER): Payer: Medicaid - Out of State | Admitting: Cardiovascular Disease

## 2017-05-24 ENCOUNTER — Encounter: Payer: Self-pay | Admitting: Cardiovascular Disease

## 2017-05-24 VITALS — BP 100/64 | HR 65 | Ht 65.0 in | Wt 251.0 lb

## 2017-05-24 DIAGNOSIS — I4892 Unspecified atrial flutter: Secondary | ICD-10-CM

## 2017-05-24 DIAGNOSIS — I10 Essential (primary) hypertension: Secondary | ICD-10-CM

## 2017-05-24 DIAGNOSIS — Z87891 Personal history of nicotine dependence: Secondary | ICD-10-CM

## 2017-05-24 DIAGNOSIS — Q249 Congenital malformation of heart, unspecified: Secondary | ICD-10-CM | POA: Diagnosis not present

## 2017-05-24 DIAGNOSIS — R0609 Other forms of dyspnea: Secondary | ICD-10-CM

## 2017-05-24 DIAGNOSIS — I429 Cardiomyopathy, unspecified: Secondary | ICD-10-CM

## 2017-05-24 NOTE — Patient Instructions (Signed)
Medication Instructions:  Continue all current medications.  Labwork: none  Testing/Procedures: none  Follow-Up: As needed.    Any Other Special Instructions Will Be Listed Below (If Applicable).  If you need a refill on your cardiac medications before your next appointment, please call your pharmacy.  

## 2017-05-24 NOTE — Progress Notes (Signed)
SUBJECTIVE: The patient presents for routine follow-up.  Spirometry demonstrated a moderate ventilatory defect.  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  [facility] reportedly demonstrated moderately reduced left ventricular systolic function, LVEF 30-35%, grade 2 diastolic dysfunction, elevated filling pressures, mild LVH, moderate left atrial dilatation, moderate to severe right ventricular dilatation, moderately reduced right ventricular systolic function, moderate right atrial dilatation, mild aortic regurgitation, and small VSD with left-to-right shunting.  Coronary angiography demonstrated normal coronary anatomy with no significant shunt. There were normal pulmonary artery and left ventricular filling pressures. Right ventricular filling pressure was mildly elevated. Cardiac output was normal.  She followed up with [facility].  She underwent atrial flutter ablation in November 2018.  She also underwent cardiac MRI. This demonstrated normalization of LV systolic function, LVEF 56%, with moderate right ventricular dilatation and RV EF of 42%.  She continues to have some fatigue and exertional dyspnea but symptoms appear to be improving. She denies palpitations and leg swelling.  Review of Systems: As per "subjective", otherwise negative.  No Known Allergies  Current Outpatient Medications  Medication Sig Dispense Refill  . apixaban (ELIQUIS) 5 MG TABS tablet Take 1 tablet (5 mg total) by mouth 2 (two) times daily. 60 tablet   . atorvastatin (LIPITOR) 40 MG tablet Take 40 mg by mouth every evening.    . cyclobenzaprine (FLEXERIL) 10 MG tablet Take 10 mg by mouth 3 (three) times daily as needed for muscle spasms.     . ferrous sulfate 325 (65 FE) MG tablet Take 325 mg by mouth daily with breakfast.    .  fluticasone (FLONASE) 50 MCG/ACT nasal spray Place 1 spray into both nostrils daily as needed for allergies or rhinitis.    . furosemide (LASIX) 40 MG tablet Take 40 mg by mouth daily.     Marland Kitchen. lisinopril (PRINIVIL,ZESTRIL) 20 MG tablet Take 20 mg by mouth daily.    . metoprolol succinate (TOPROL-XL) 50 MG 24 hr tablet Take 1 tablet (50 mg total) by mouth 2 (two) times daily. 60 tablet 6  . Potassium 99 MG TABS Take 1 tablet by mouth daily.     . sertraline (ZOLOFT) 100 MG tablet Take 150 mg by mouth daily. 1 and 1/2 tab daily      . tamsulosin (FLOMAX) 0.4 MG CAPS capsule Take 0.4 mg by mouth at bedtime.     . valACYclovir (VALTREX) 500 MG tablet Take 1,000 mg by mouth daily.      No current facility-administered medications for this visit.     Past Medical History:  Diagnosis Date  . 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 FH: TAH-BSO (total abdominal hysterectomy and bilateral salpingo-oophorectomy)   . Hemorrhagic cerebellum (HCC)    right cerebellum microhemorrage  . Hyperlipidemia   . Shingles     Past Surgical History:  Procedure Laterality Date  . ABDOMINAL HYSTERECTOMY    . RIGHT/LEFT HEART CATH AND CORONARY ANGIOGRAPHY N/A 02/08/2017   Procedure: RIGHT/LEFT HEART CATH AND CORONARY ANGIOGRAPHY;  Surgeon: SwazilandJordan, Peter M, MD;  [facility];  Service: Cardiovascular;  Laterality: N/A;  . TETRALOGY OF FALLOT REPAIR    . TUBAL LIGATION    . URETERAL STENT PLACEMENT      Social History  Socioeconomic History  . Marital status: Married    Spouse name: Not on file  . Number of children: Not on file  . Years of education: Not on file  . Highest education level: Not on file  Social Needs  . Financial resource strain: Not on file  . Food insecurity - worry: Not on file  . Food insecurity - inability: Not on file  . Transportation needs - medical: Not on file  . Transportation needs - non-medical: Not on file    Occupational History  . Not on file  Tobacco Use  . Smoking status: Former Smoker    Last attempt to quit: 05/01/2008    Years since quitting: 9.0  . Smokeless tobacco: Never Used  Substance and Sexual Activity  . Alcohol use: No  . Drug use: No  . Sexual activity: Yes  Other Topics Concern  . Not on file  Social History Narrative  . Not on file     Vitals:   05/24/17 1305  BP: 100/64  Pulse: 65  SpO2: 97%  Weight: 251 lb (113.9 kg)  Height: 5\' 5"  (1.651 m)    Wt Readings from Last 3 Encounters:  05/24/17 251 lb (113.9 kg)  02/15/17 243 lb (110.2 kg)  02/08/17 241 lb (109.3 kg)     PHYSICAL EXAM General: NAD HEENT: Normal. Neck: No JVD, no thyromegaly. Lungs: Clear to auscultation bilaterally with normal respiratory effort. CV: Regular rate and rhythm, normal S1/S2, no S3/S4, no murmur. No pretibial or periankle edema.  Abdomen: Soft, nontender, no distention.  Neurologic: Alert and oriented.  Psych: Normal affect. Skin: Normal. Musculoskeletal: No gross deformities.    ECG: Most recent ECG reviewed.   Labs: Lab Results  Component Value Date/Time   K 4.7 02/05/2017 12:59 PM   BUN 26 (H) 02/05/2017 12:59 PM   CREATININE 1.04 (H) 02/05/2017 12:59 PM   HGB 14.0 02/05/2017 12:59 PM     Lipids: No results found for: LDLCALC, LDLDIRECT, CHOL, TRIG, HDL     ASSESSMENT AND PLAN:  1. Atrial flutter: She is stable from an arrhythmic standpoint. ContinueToprol-XL 50mg  twice daily. She is anticoagulated with apixaban. She underwent atrial flutter ablation at [facility].  2. 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 ContinueToprol-XL 50mg  twice daily andlisinopril 20 mg daily along with Lasix 40 mg daily.  She is no longer on Entresto as she could not afford it.  3. Hypertension: Controlled. No changes.  4.  Repaired Tetralogy of Fallot: She now follows with Dr. Virgina Jock, an adult congenital heart disease specialist with [facility].   5. Dyspnea with exertion: She also has a past history of tobacco abuse.  Spirometry demonstrated a moderate ventilatory defect.  I scheduled her to see a pulmonologist.  She is also deconditioned from a cardiopulmonary standpoint.  Symptoms are gradually improving.    Disposition: Follow up with me prn. Follow up with Dr. Deatra James as scheduled.   Prentice Docker, M.D., F.A.C.C.

## 2017-08-16 ENCOUNTER — Other Ambulatory Visit: Payer: Self-pay | Admitting: Cardiovascular Disease

## 2018-03-17 ENCOUNTER — Other Ambulatory Visit: Payer: Self-pay | Admitting: Cardiovascular Disease

## 2018-09-05 IMAGING — NM NM MYOCAR MULTI W/SPECT W/WALL MOTION & EF
2 series · 12 of 12 positions shown · non-contrast
Comparison: none

[Series 1: rest · 6.51mm/px · 6 of 64 frames shown]
[frame 6/64]
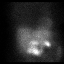
[frame 16/64]
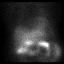
[frame 27/64]
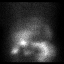
[frame 38/64]
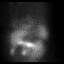
[frame 48/64]
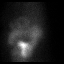
[frame 59/64]
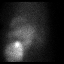

[Series 2: stress gated - perfusion · 6.51mm/px · 6 of 64 frames shown]
[frame 6/64]
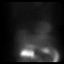
[frame 16/64]
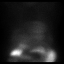
[frame 27/64]
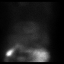
[frame 38/64]
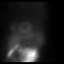
[frame 48/64]
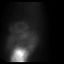
[frame 59/64]
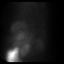

[12 of 12 positions shown; findings below may reference images not displayed]

Canned report from images found in remote index.

Refer to host system for actual result text.

## 2018-09-18 IMAGING — DX DG CHEST 2V
2 series · 2 of 2 positions shown · non-contrast
Comparison: None.

CLINICAL DATA: Preoperative evaluation.

EXAM:
CHEST  2 VIEW

[chest pa]
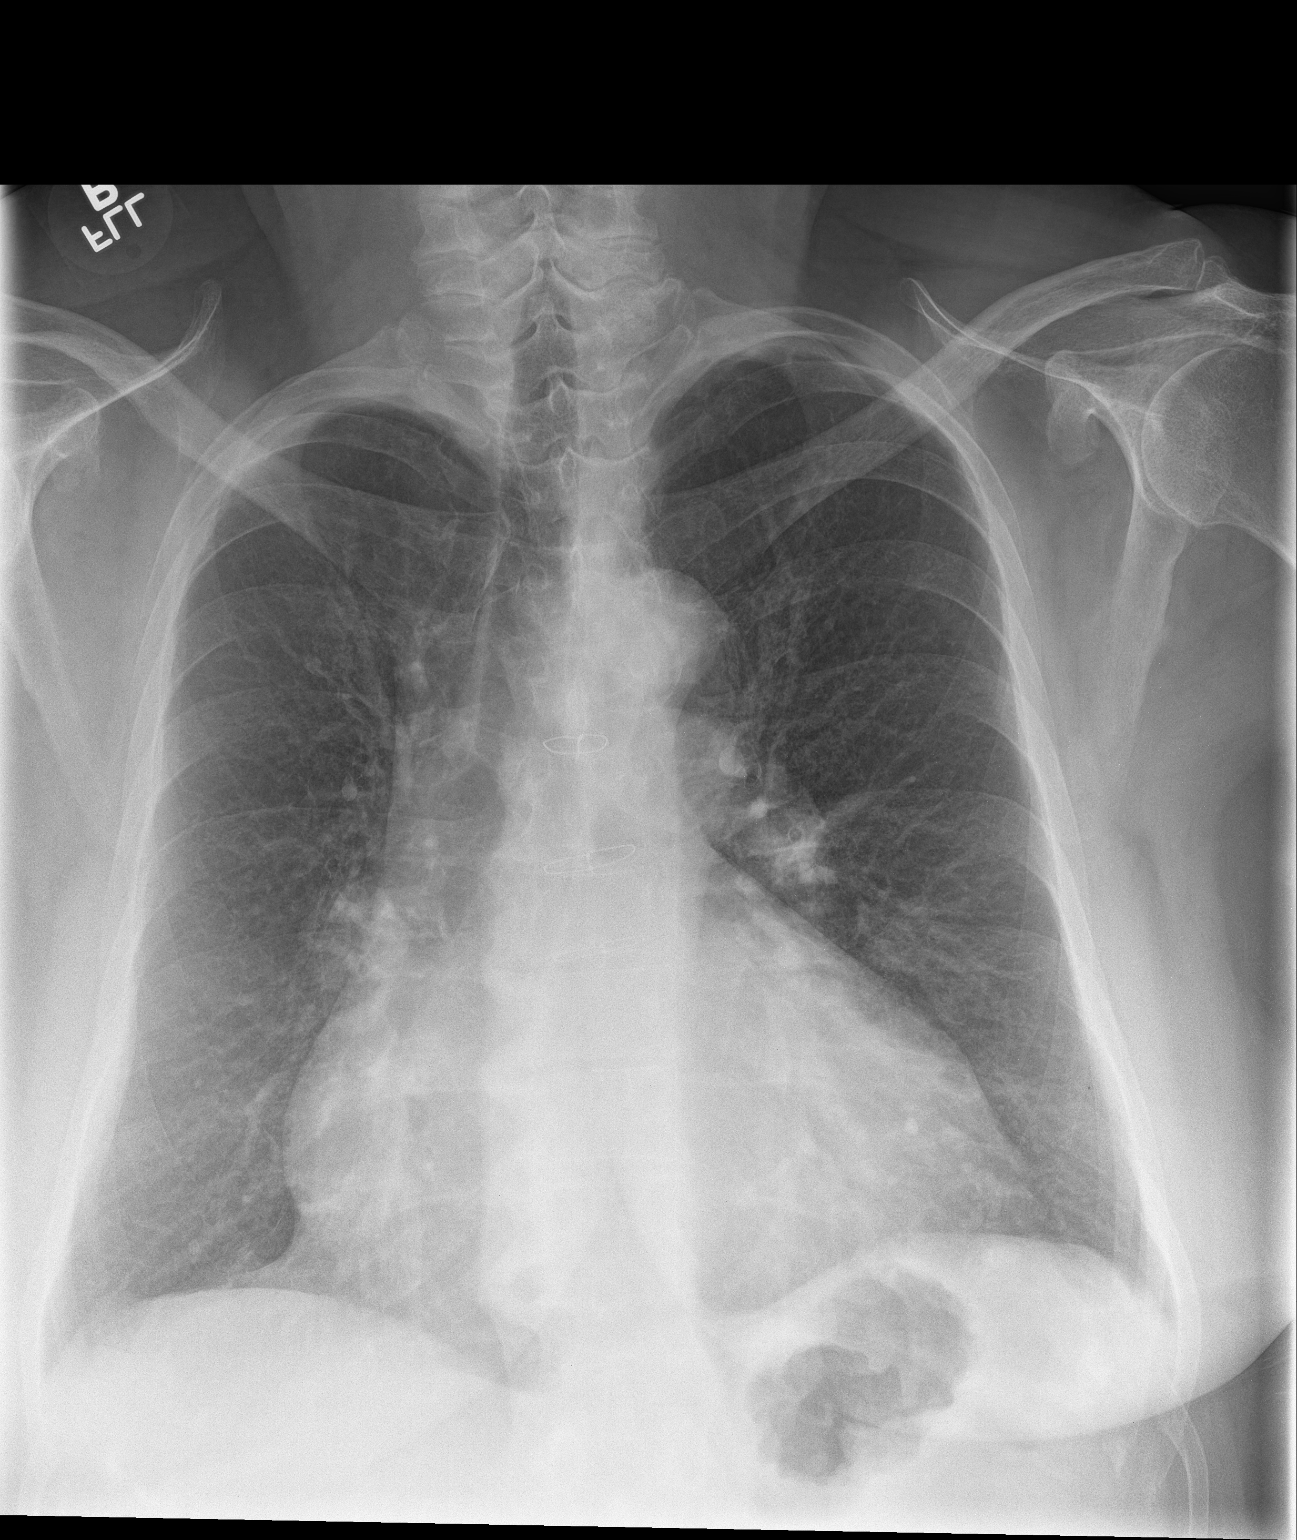

[chest lat]
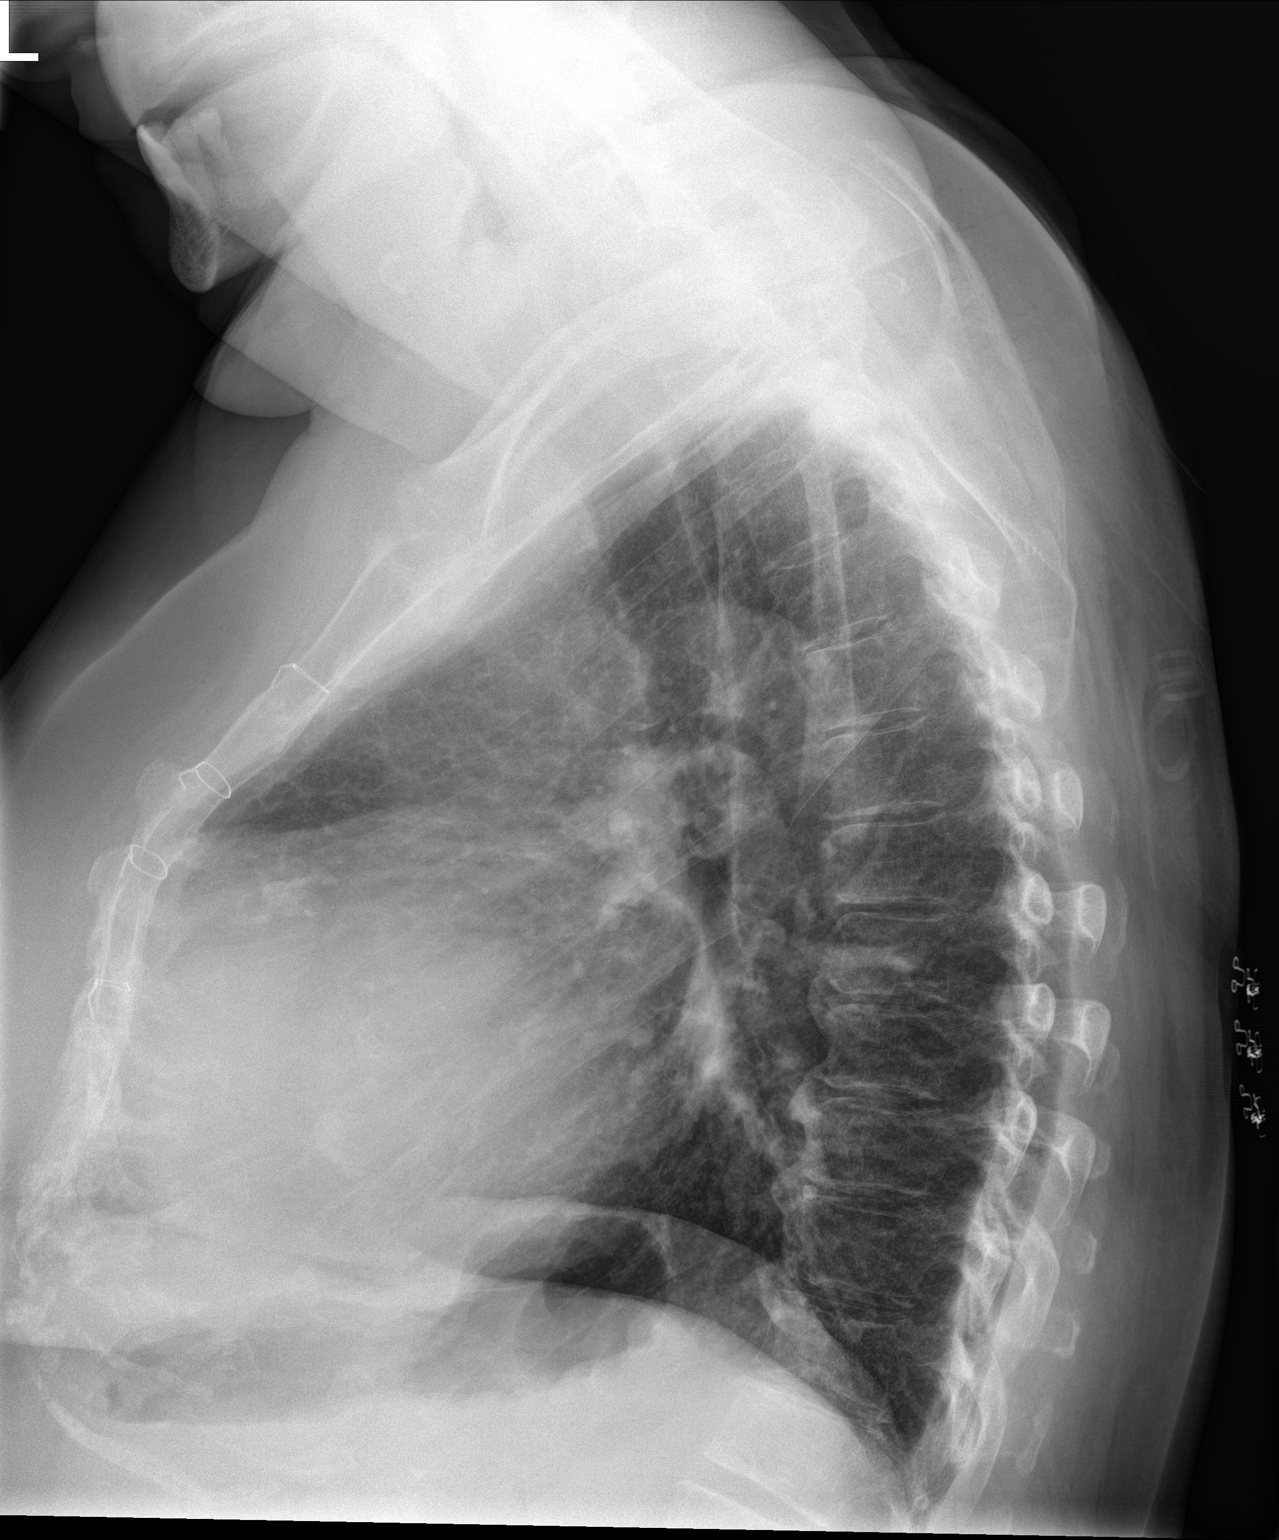

[2 of 2 positions shown; findings below may reference images not displayed]

FINDINGS: Postsurgical changes from median sternotomy.

The cardiac silhouette is enlarged. Mediastinal contours appear
intact. Tortuosity and atherosclerotic disease of the aorta.

There is no evidence of focal airspace consolidation, pleural
effusion or pneumothorax.

Multilevel osteoarthritic changes of the thoracic spine. Soft
tissues are grossly normal.
IMPRESSION: Enlarged cardiac silhouette.

Tortuosity and atherosclerotic disease of the aorta.
